# Patient Record
Sex: Female | Born: 2000 | Race: Black or African American | Hispanic: No | Marital: Single | Smoking: Never smoker
Health system: Southern US, Community
[De-identification: ages and names within clinical notes are randomized; demographics above are authoritative.]

---
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
## 2022-09-09 IMAGING — CT CT CERVICAL SPINE W/O CM
3 of 4 series · 12 of 33 positions shown, 14 images · non-contrast
Comparison: None.

CLINICAL DATA: Facial trauma, head injury, lip laceration, neck
pain

EXAM:
CT HEAD WITHOUT CONTRAST
CT MAXILLOFACIAL WITHOUT CONTRAST
CT CERVICAL SPINE WITHOUT CONTRAST
TECHNIQUE: Multidetector CT imaging of the head, cervical spine, and
maxillofacial structures were performed using the standard protocol
without intravenous contrast. Multiplanar CT image reconstructions
of the cervical spine and maxillofacial structures were also
generated.

[Series 5: sagittal bone · sagittal · 0.27mm/px · 5 of 61 slices shown, 6 images]
[im 21/61  bone]
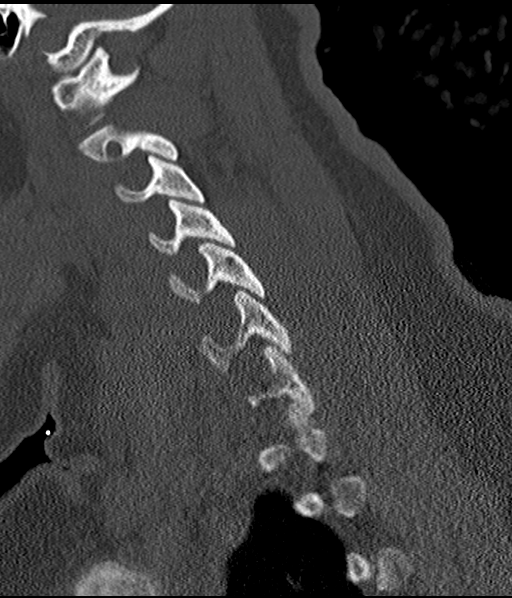
[im 26/61  bone]
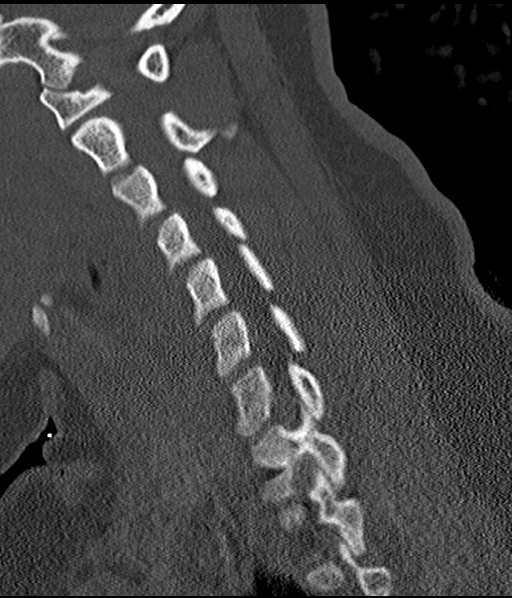
[im 31/61  soft-tissue]
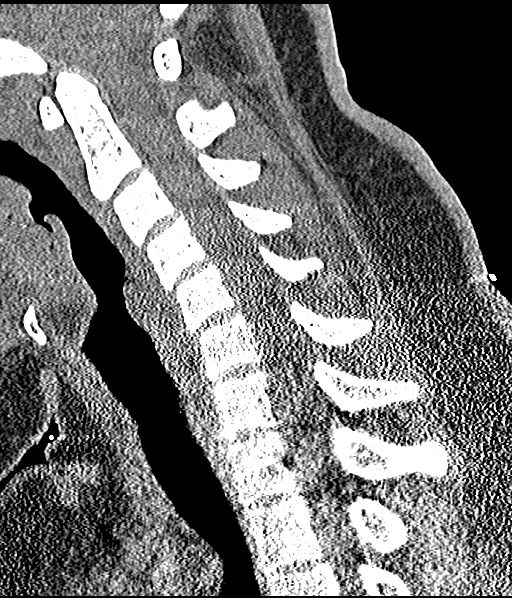
[im 31/61  bone]
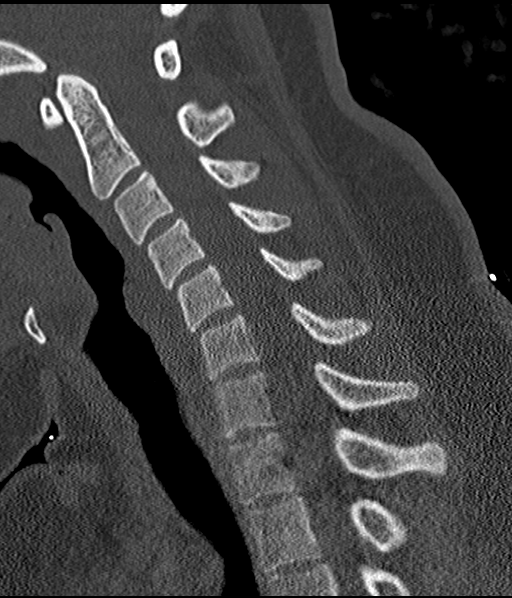
[im 36/61  bone]
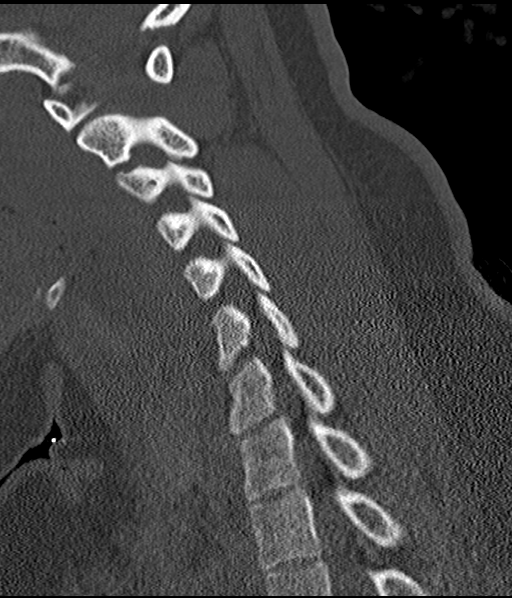
[im 41/61  bone]
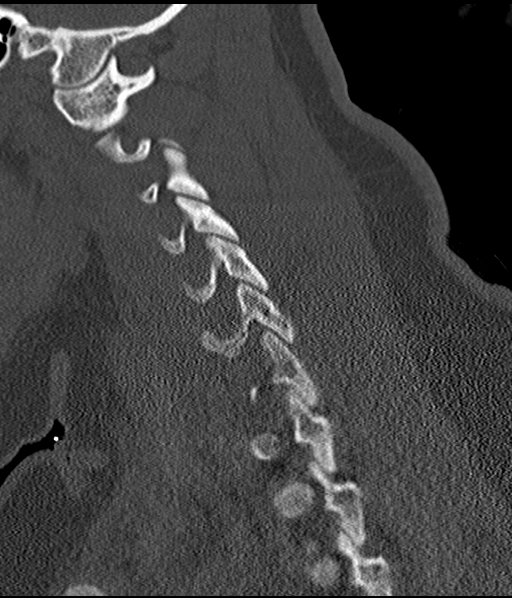

[Series 6: coronal bone · coronal · 0.23mm/px · 3 of 49 slices shown]
[im 10/49  bone]
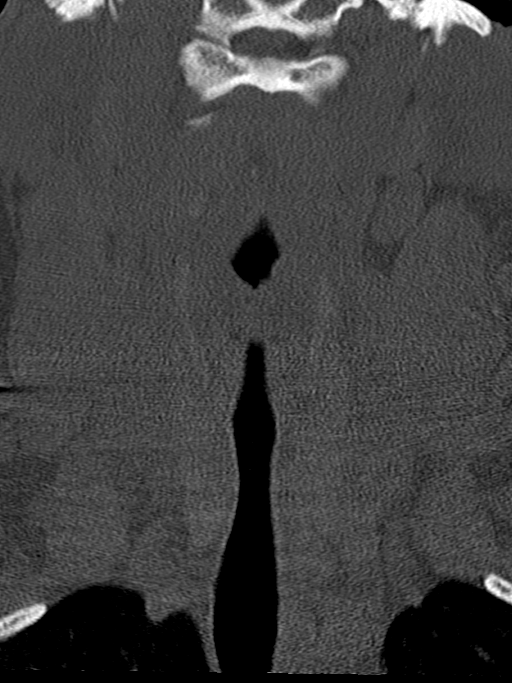
[im 20/49  bone]
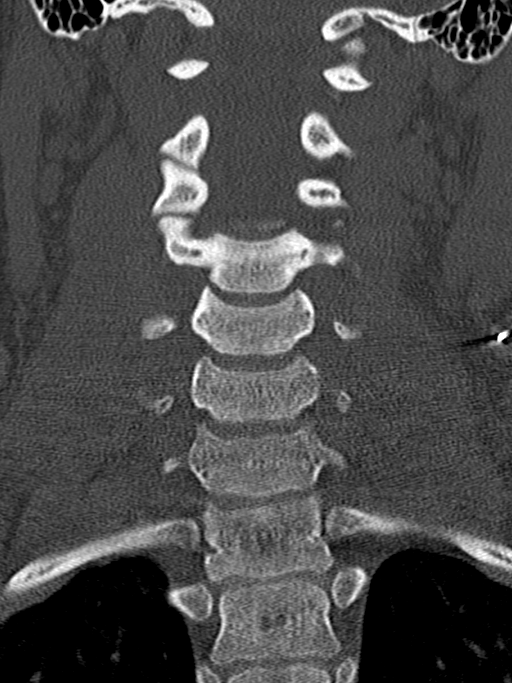
[im 29/49  bone]
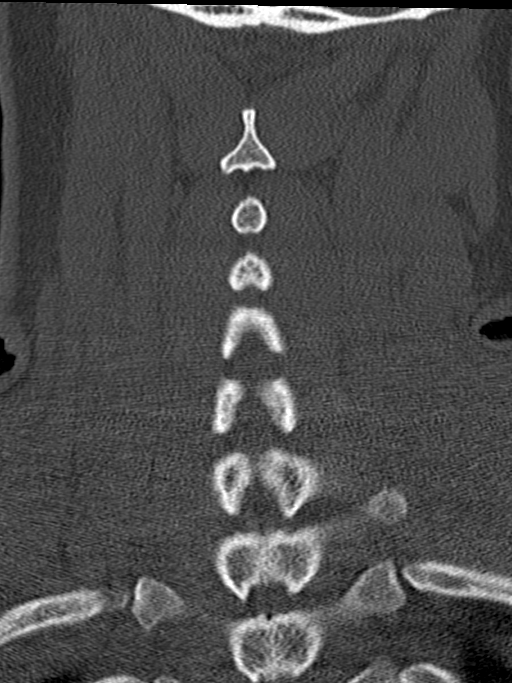

[Series 7: orthogonal axials · axial · 0.19mm/px · z∈[-362,-256]mm · 4 of 86 slices shown, 5 images]
[im 15/86  soft-tissue]
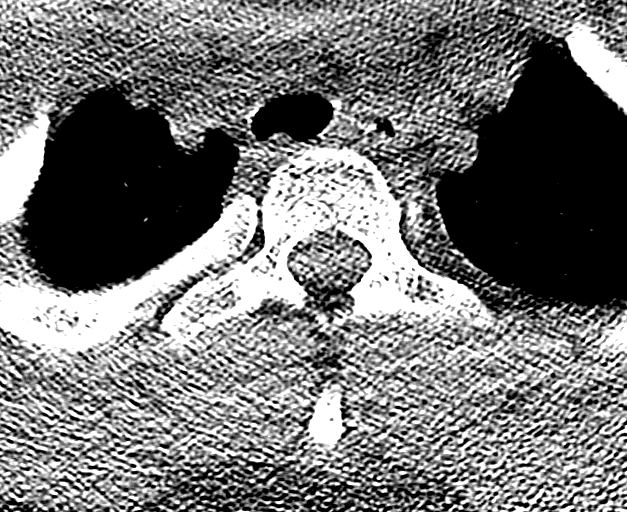
[im 15/86  bone]
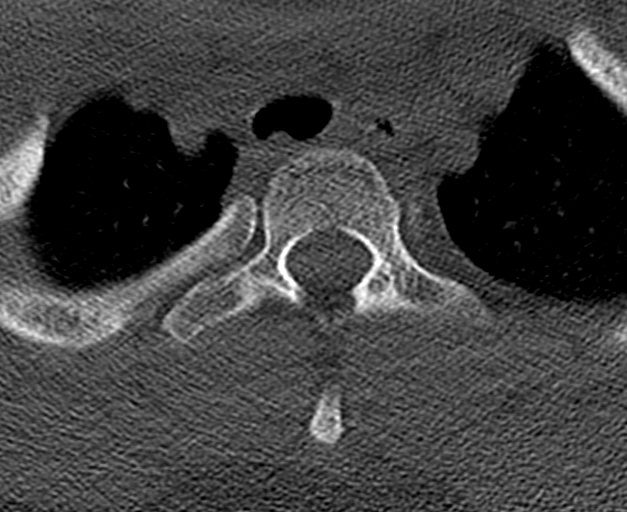
[im 29/86  bone]
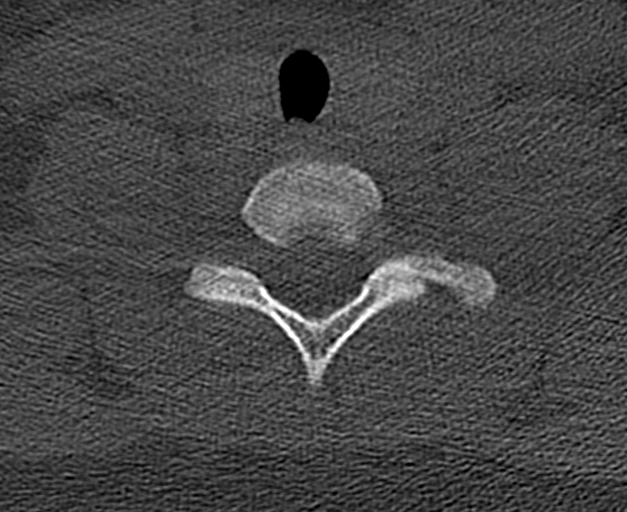
[im 57/86  bone]
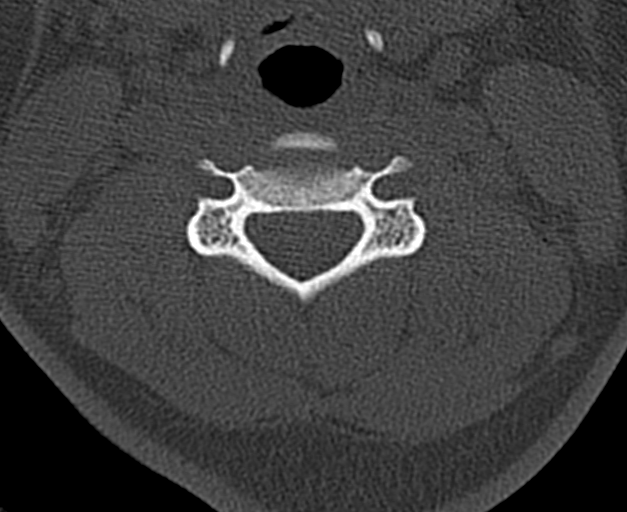
[im 71/86  bone]
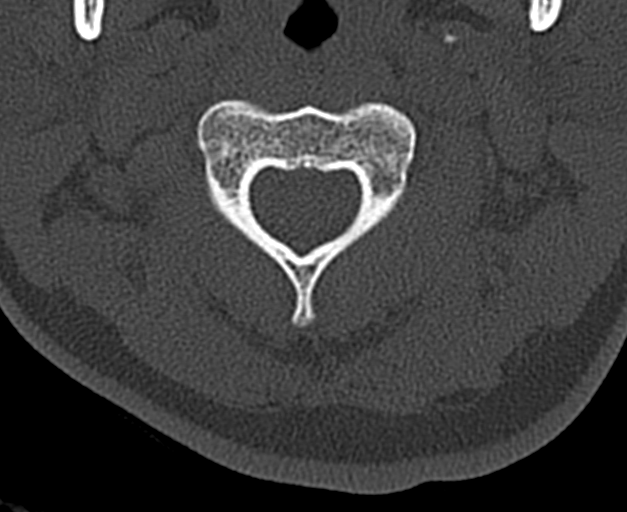

[12 of 33 positions shown; findings below may reference images not displayed]

FINDINGS: CT HEAD FINDINGS

Brain: No evidence of acute infarction, hemorrhage, hydrocephalus,
extra-axial collection or mass lesion/mass effect.

Vascular: No hyperdense vessel or unexpected calcification.

Skull: Normal. Negative for fracture or focal lesion.

Other: None.

CT MAXILLOFACIAL FINDINGS

Osseous: Left nasal bone fracture (series 3/image 51), without
overlying soft tissue swelling, favored to be chronic.

No acute maxillofacial fracture. Mandible is intact. Bilateral
mandibular condyles are well-seated in the TMJs.

Orbits: Bilateral orbits, including the globes and retroconal soft
tissues, are within normal limits.

Sinuses: The visualized paranasal sinuses are essentially clear. The
mastoid air cells are unopacified.

Soft tissues: Mild soft tissue swelling overlying the right maxilla
(series 2/image 82). Suspected mild swelling of the left upper lip
(series 2/image 30).

CT CERVICAL SPINE FINDINGS

Alignment: Reversal of the normal cervical lordosis.

Skull base and vertebrae: No acute fracture. No primary bone lesion
or focal pathologic process.

Soft tissues and spinal canal: No prevertebral fluid or swelling. No
visible canal hematoma.

Disc levels: Intervertebral disc spaces are maintained. Spinal canal
is patent.

Upper chest: Visualized lung apices are clear.

Other: Visualized thyroid is unremarkable
IMPRESSION: Normal head CT.

Mild soft tissue swelling overlying the right maxilla. No evidence
of acute maxillofacial fracture. Suspected old left nasal bone
fracture.

Normal cervical spine CT.

## 2022-09-09 IMAGING — CT CT MAXILLOFACIAL W/O CM
3 series · 15 of 47 positions shown, 18 images · non-contrast
Comparison: None.

CLINICAL DATA: Facial trauma, head injury, lip laceration, neck
pain

EXAM:
CT HEAD WITHOUT CONTRAST
CT MAXILLOFACIAL WITHOUT CONTRAST
CT CERVICAL SPINE WITHOUT CONTRAST
TECHNIQUE: Multidetector CT imaging of the head, cervical spine, and
maxillofacial structures were performed using the standard protocol
without intravenous contrast. Multiplanar CT image reconstructions
of the cervical spine and maxillofacial structures were also
generated.

[Series 2: max soft · axial · 0.36mm/px · z∈[-342,-216]mm · 9 of 75 slices shown, 12 images]
[im 6/75  brain]
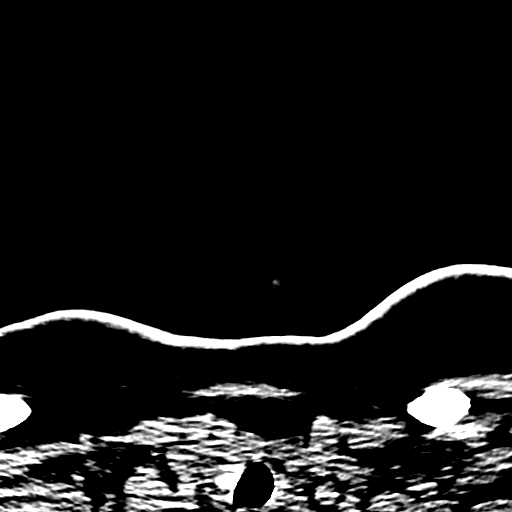
[im 6/75  bone]
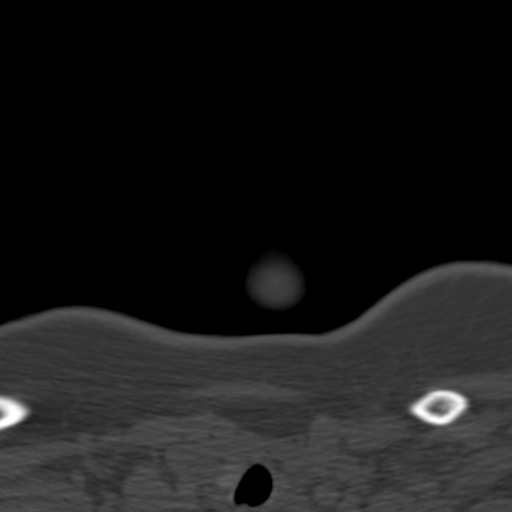
[im 13/75  bone]
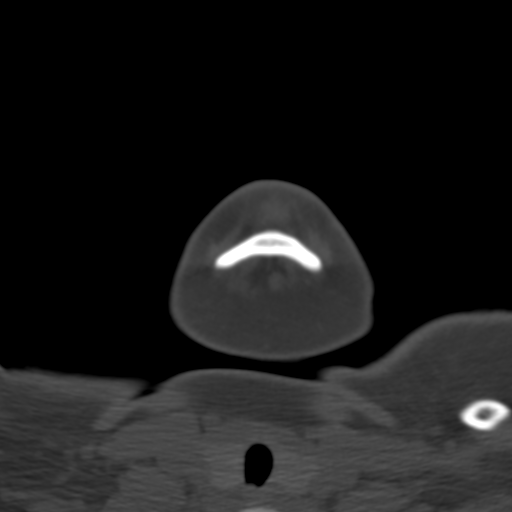
[im 21/75  bone]
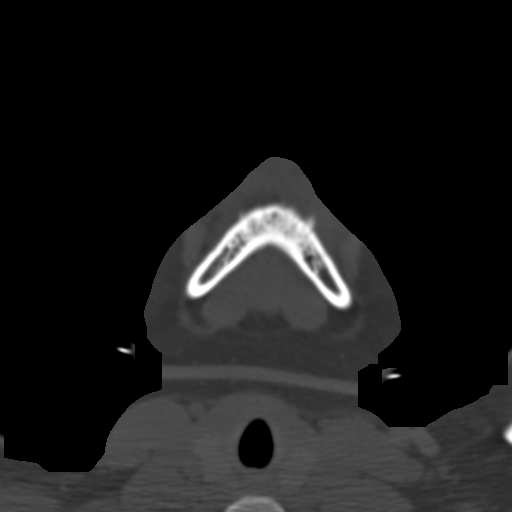
[im 29/75  bone]
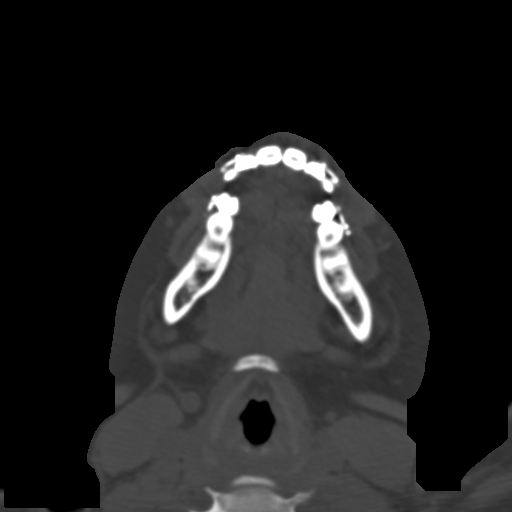
[im 39/75  brain]
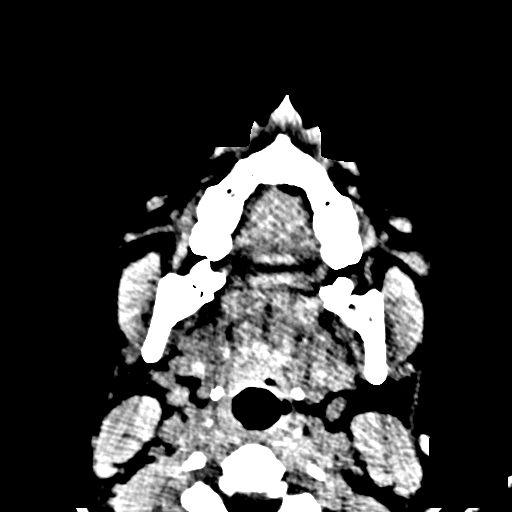
[im 39/75  bone]
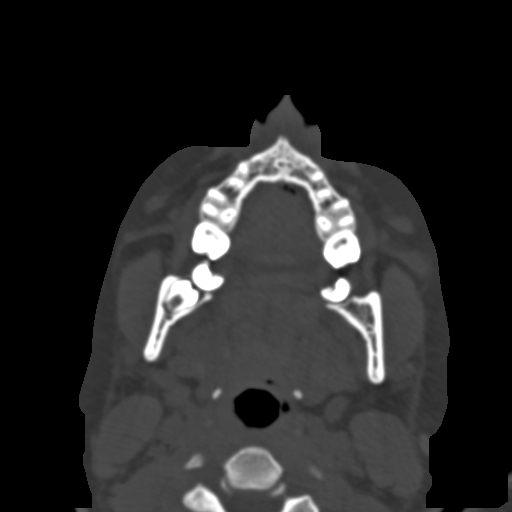
[im 46/75  bone]
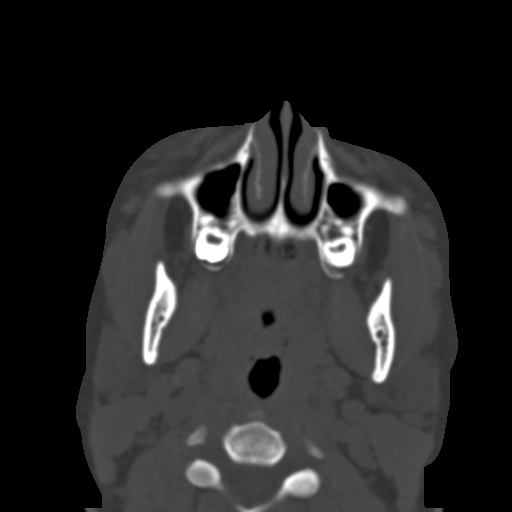
[im 54/75  bone]
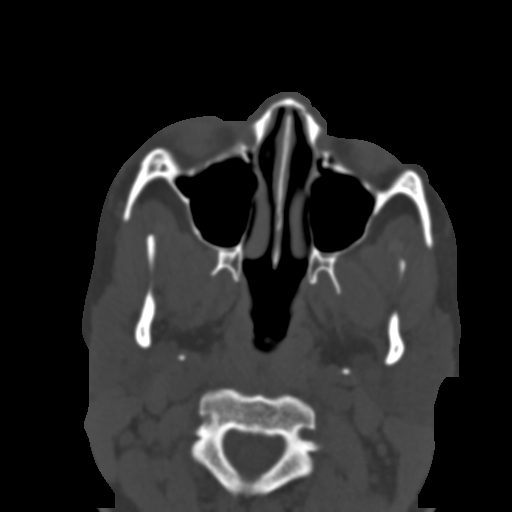
[im 62/75  bone]
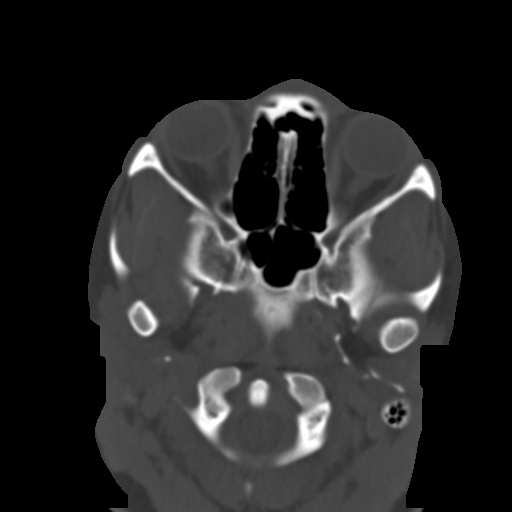
[im 69/75  brain]
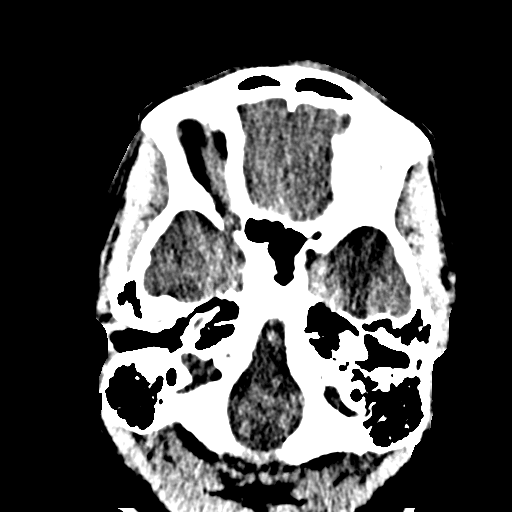
[im 69/75  bone]
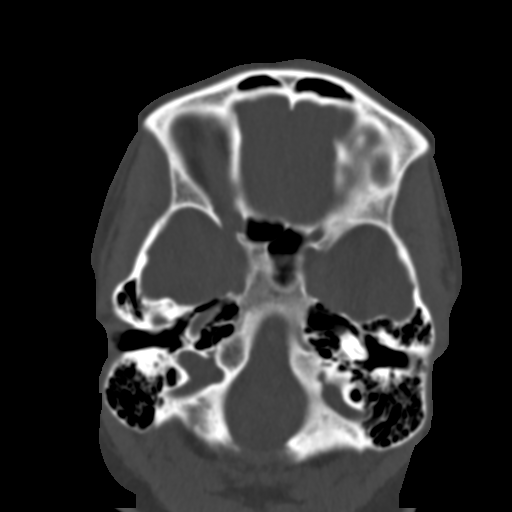

[Series 6: coronal soft · coronal · 0.30mm/px · 3 of 76 slices shown]
[im 26/76  bone]
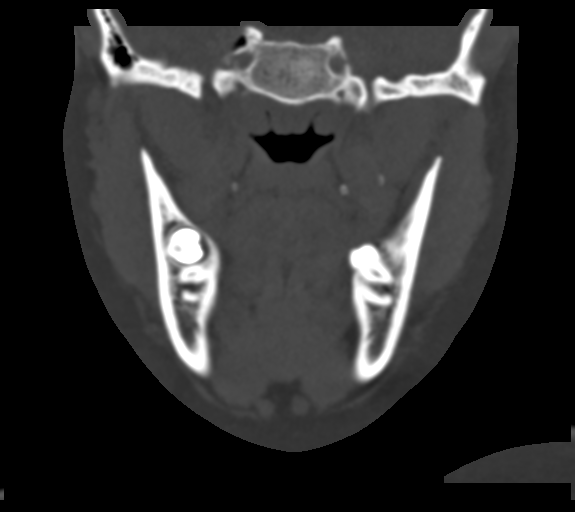
[im 34/76  bone]
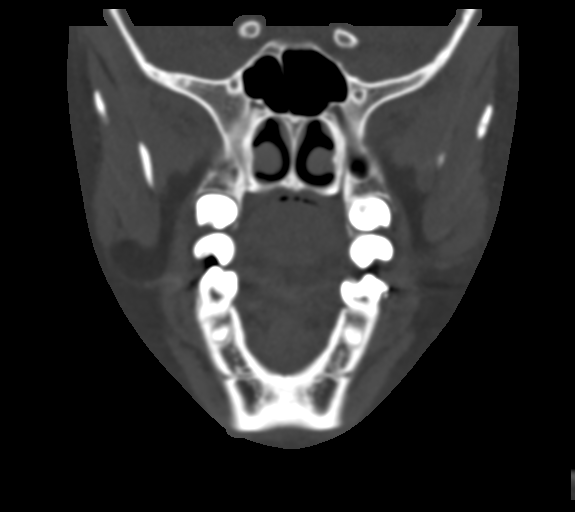
[im 42/76  bone]
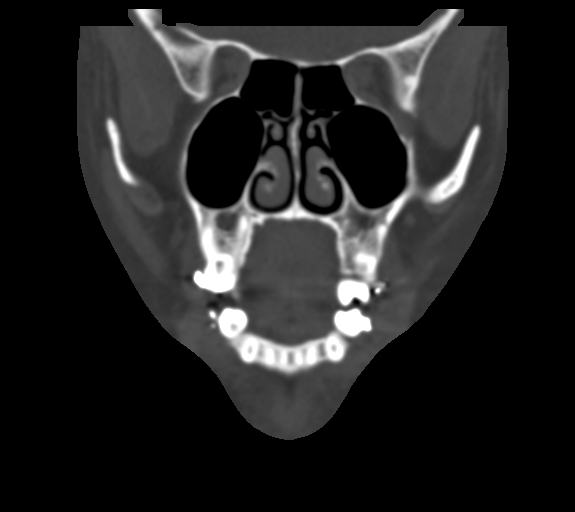

[Series 7: sagittal soft · sagittal · 0.31mm/px · 3 of 82 slices shown]
[im 28/82  bone]
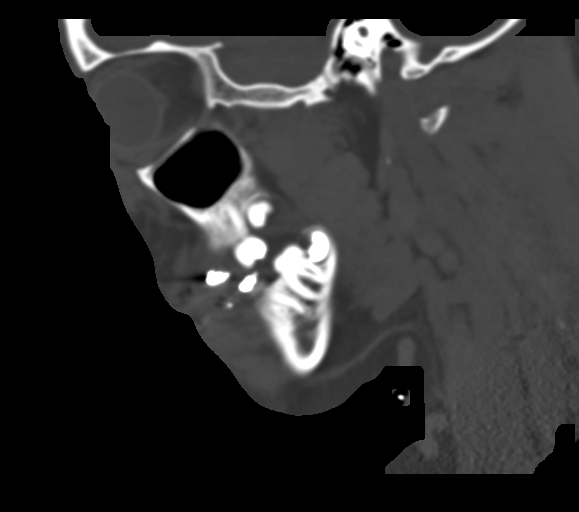
[im 41/82  bone]
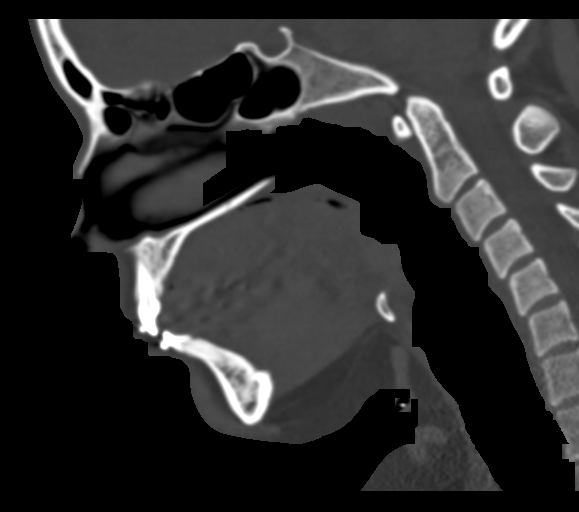
[im 55/82  bone]
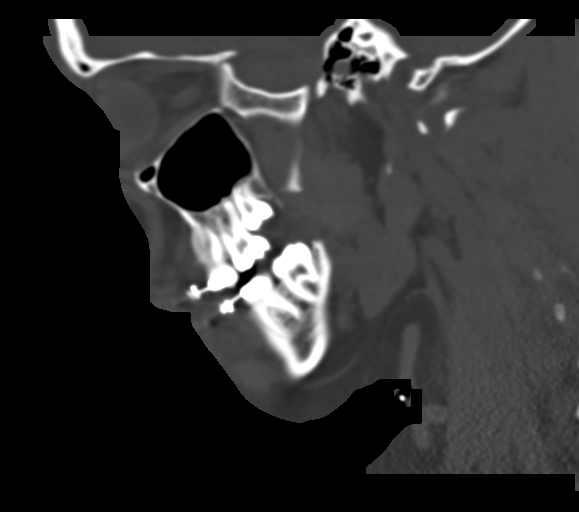

[15 of 47 positions shown; findings below may reference images not displayed]

FINDINGS: CT HEAD FINDINGS

Brain: No evidence of acute infarction, hemorrhage, hydrocephalus,
extra-axial collection or mass lesion/mass effect.

Vascular: No hyperdense vessel or unexpected calcification.

Skull: Normal. Negative for fracture or focal lesion.

Other: None.

CT MAXILLOFACIAL FINDINGS

Osseous: Left nasal bone fracture (series 3/image 51), without
overlying soft tissue swelling, favored to be chronic.

No acute maxillofacial fracture. Mandible is intact. Bilateral
mandibular condyles are well-seated in the TMJs.

Orbits: Bilateral orbits, including the globes and retroconal soft
tissues, are within normal limits.

Sinuses: The visualized paranasal sinuses are essentially clear. The
mastoid air cells are unopacified.

Soft tissues: Mild soft tissue swelling overlying the right maxilla
(series 2/image 82). Suspected mild swelling of the left upper lip
(series 2/image 30).

CT CERVICAL SPINE FINDINGS

Alignment: Reversal of the normal cervical lordosis.

Skull base and vertebrae: No acute fracture. No primary bone lesion
or focal pathologic process.

Soft tissues and spinal canal: No prevertebral fluid or swelling. No
visible canal hematoma.

Disc levels: Intervertebral disc spaces are maintained. Spinal canal
is patent.

Upper chest: Visualized lung apices are clear.

Other: Visualized thyroid is unremarkable
IMPRESSION: Normal head CT.

Mild soft tissue swelling overlying the right maxilla. No evidence
of acute maxillofacial fracture. Suspected old left nasal bone
fracture.

Normal cervical spine CT.

## 2022-09-09 IMAGING — CT CT HEAD W/O CM
3 series · 14 of 47 positions shown, 16 images · non-contrast
Comparison: None.

CLINICAL DATA: Facial trauma, head injury, lip laceration, neck
pain

EXAM:
CT HEAD WITHOUT CONTRAST
CT MAXILLOFACIAL WITHOUT CONTRAST
CT CERVICAL SPINE WITHOUT CONTRAST
TECHNIQUE: Multidetector CT imaging of the head, cervical spine, and
maxillofacial structures were performed using the standard protocol
without intravenous contrast. Multiplanar CT image reconstructions
of the cervical spine and maxillofacial structures were also
generated.

[Series 2: head wo · axial · 0.46mm/px · z∈[-235,-105]mm · 8 of 32 slices shown, 10 images]
[im 3/32  brain]
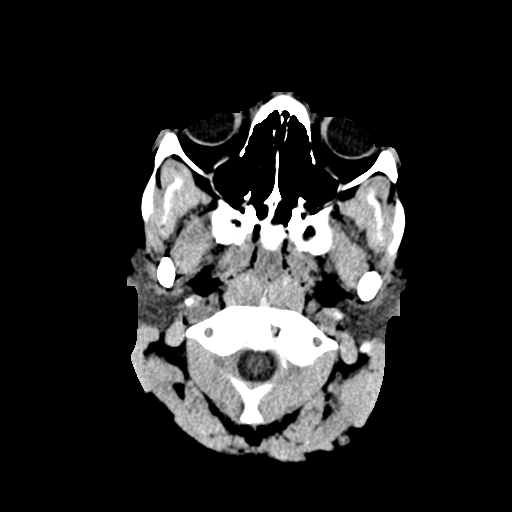
[im 3/32  bone]
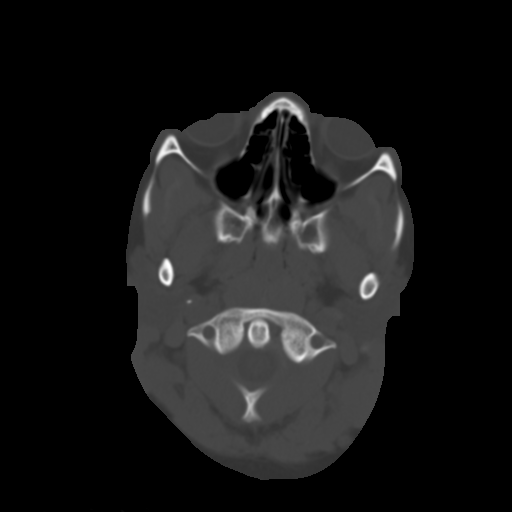
[im 7/32  brain]
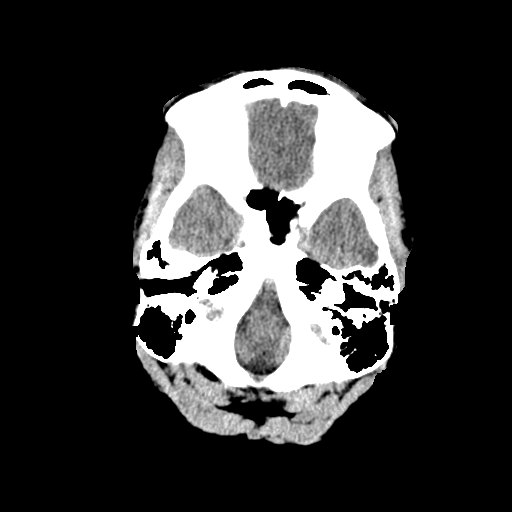
[im 10/32  brain]
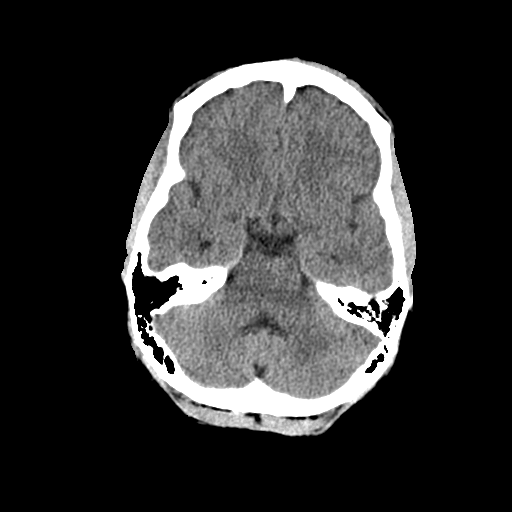
[im 14/32  brain]
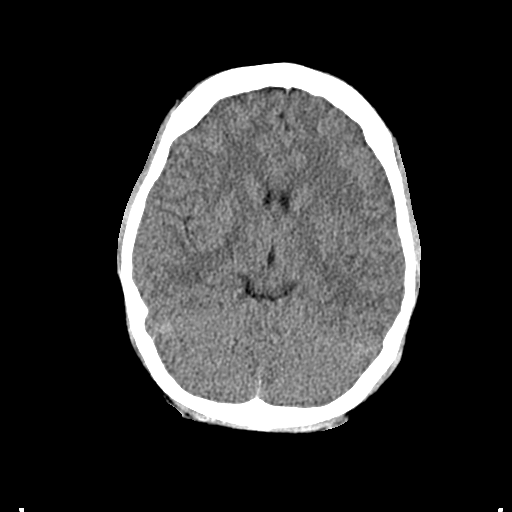
[im 18/32  brain]
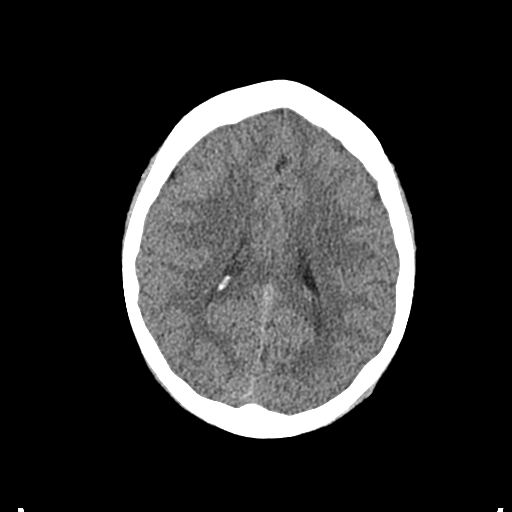
[im 18/32  bone]
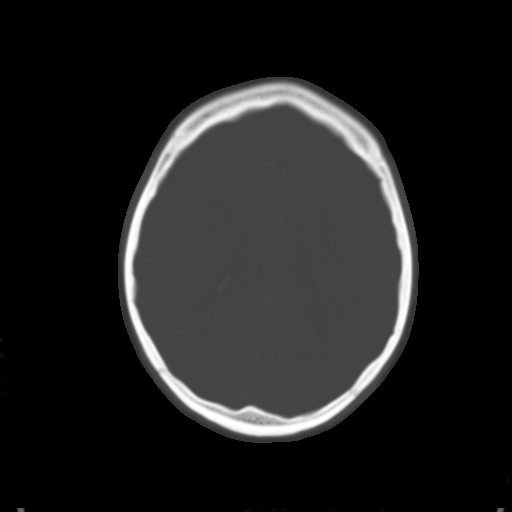
[im 22/32  brain]
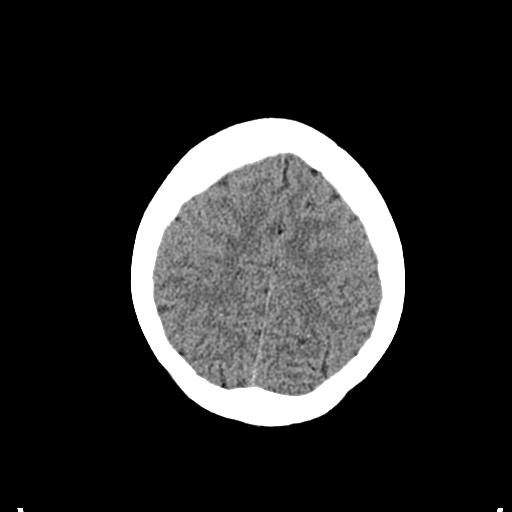
[im 25/32  brain]
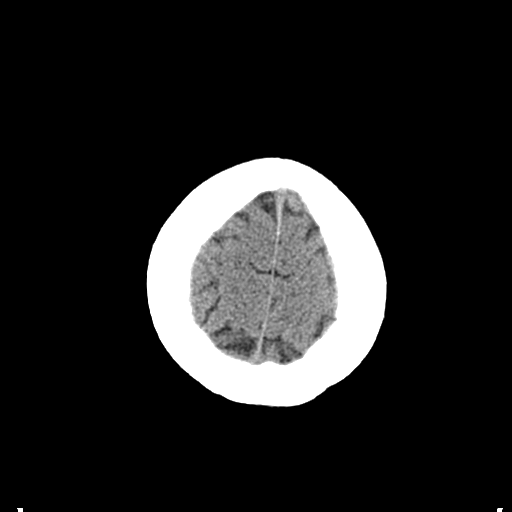
[im 29/32  brain]
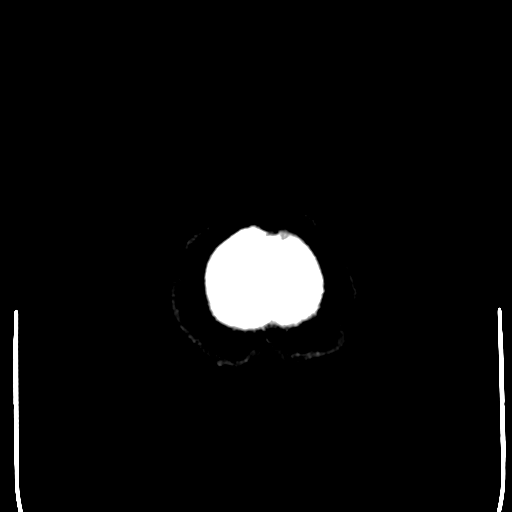

[Series 4: head coronal · coronal · 0.32mm/px · 3 of 73 slices shown]
[im 26/73  brain]
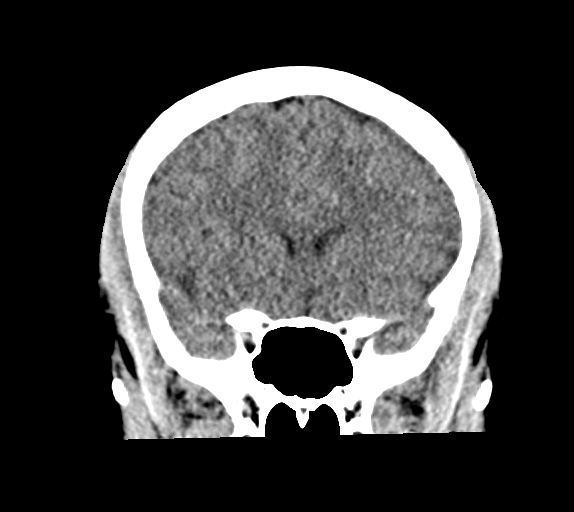
[im 33/73  brain]
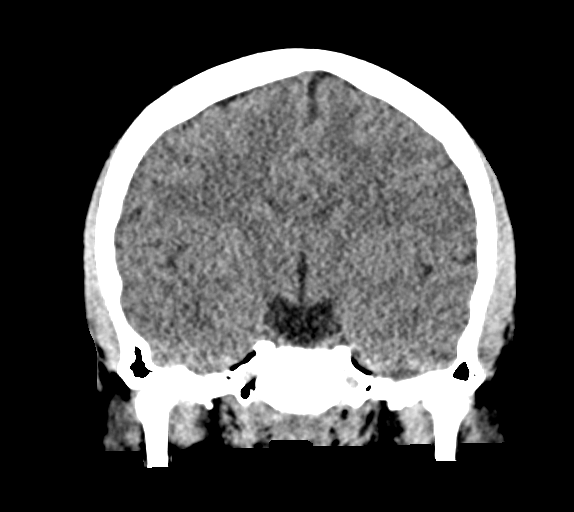
[im 40/73  brain]
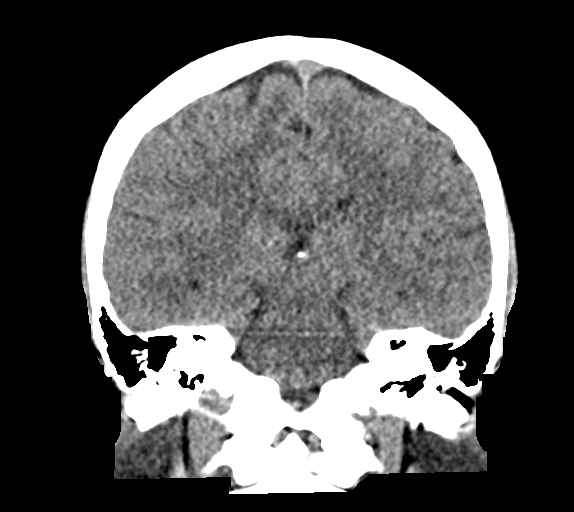

[Series 5: head sagittal · sagittal · 0.32mm/px · 3 of 59 slices shown]
[im 20/59  brain]
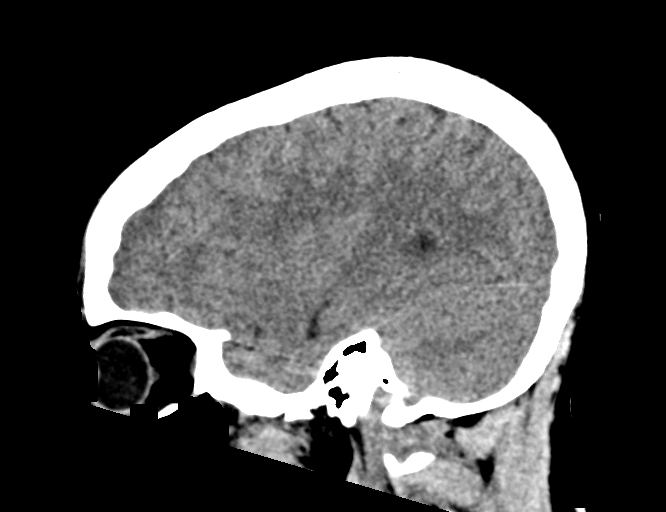
[im 30/59  brain]
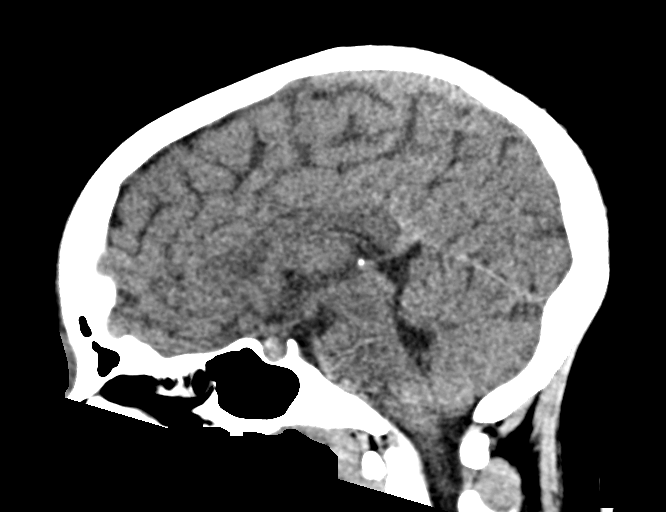
[im 39/59  brain]
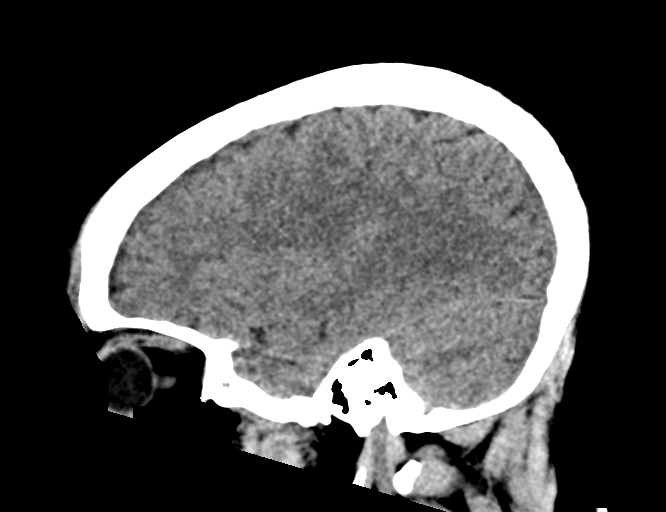

[14 of 47 positions shown; findings below may reference images not displayed]

FINDINGS: CT HEAD FINDINGS

Brain: No evidence of acute infarction, hemorrhage, hydrocephalus,
extra-axial collection or mass lesion/mass effect.

Vascular: No hyperdense vessel or unexpected calcification.

Skull: Normal. Negative for fracture or focal lesion.

Other: None.

CT MAXILLOFACIAL FINDINGS

Osseous: Left nasal bone fracture (series 3/image 51), without
overlying soft tissue swelling, favored to be chronic.

No acute maxillofacial fracture. Mandible is intact. Bilateral
mandibular condyles are well-seated in the TMJs.

Orbits: Bilateral orbits, including the globes and retroconal soft
tissues, are within normal limits.

Sinuses: The visualized paranasal sinuses are essentially clear. The
mastoid air cells are unopacified.

Soft tissues: Mild soft tissue swelling overlying the right maxilla
(series 2/image 82). Suspected mild swelling of the left upper lip
(series 2/image 30).

CT CERVICAL SPINE FINDINGS

Alignment: Reversal of the normal cervical lordosis.

Skull base and vertebrae: No acute fracture. No primary bone lesion
or focal pathologic process.

Soft tissues and spinal canal: No prevertebral fluid or swelling. No
visible canal hematoma.

Disc levels: Intervertebral disc spaces are maintained. Spinal canal
is patent.

Upper chest: Visualized lung apices are clear.

Other: Visualized thyroid is unremarkable
IMPRESSION: Normal head CT.

Mild soft tissue swelling overlying the right maxilla. No evidence
of acute maxillofacial fracture. Suspected old left nasal bone
fracture.

Normal cervical spine CT.

## 2022-11-03 ENCOUNTER — Ambulatory Visit: Payer: Medicaid Other | Admitting: Internal Medicine

## 2023-02-03 DIAGNOSIS — Z308 Encounter for other contraceptive management: Secondary | ICD-10-CM | POA: Diagnosis not present

## 2023-02-03 DIAGNOSIS — Z3202 Encounter for pregnancy test, result negative: Secondary | ICD-10-CM | POA: Diagnosis not present

## 2023-02-03 DIAGNOSIS — N760 Acute vaginitis: Secondary | ICD-10-CM | POA: Diagnosis not present

## 2023-02-03 DIAGNOSIS — Z113 Encounter for screening for infections with a predominantly sexual mode of transmission: Secondary | ICD-10-CM | POA: Diagnosis not present

## 2023-02-03 DIAGNOSIS — R300 Dysuria: Secondary | ICD-10-CM | POA: Diagnosis not present

## 2023-07-04 ENCOUNTER — Encounter (HOSPITAL_BASED_OUTPATIENT_CLINIC_OR_DEPARTMENT_OTHER): Payer: Self-pay

## 2023-07-04 ENCOUNTER — Emergency Department (HOSPITAL_BASED_OUTPATIENT_CLINIC_OR_DEPARTMENT_OTHER)

## 2023-07-04 ENCOUNTER — Emergency Department (HOSPITAL_BASED_OUTPATIENT_CLINIC_OR_DEPARTMENT_OTHER)
Admission: EM | Admit: 2023-07-04 | Discharge: 2023-07-04 | Disposition: A | Discharge status: Home / Self Care | Attending: Emergency Medicine | Admitting: Emergency Medicine

## 2023-07-04 DIAGNOSIS — Z043 Encounter for examination and observation following other accident: Secondary | ICD-10-CM | POA: Diagnosis not present

## 2023-07-04 DIAGNOSIS — S93492A Sprain of other ligament of left ankle, initial encounter: Secondary | ICD-10-CM | POA: Diagnosis not present

## 2023-07-04 DIAGNOSIS — M25572 Pain in left ankle and joints of left foot: Secondary | ICD-10-CM | POA: Diagnosis not present

## 2023-07-04 DIAGNOSIS — Y990 Civilian activity done for income or pay: Secondary | ICD-10-CM | POA: Insufficient documentation

## 2023-07-04 DIAGNOSIS — X501XXA Overexertion from prolonged static or awkward postures, initial encounter: Secondary | ICD-10-CM | POA: Insufficient documentation

## 2023-07-04 DIAGNOSIS — S93402A Sprain of unspecified ligament of left ankle, initial encounter: Secondary | ICD-10-CM | POA: Diagnosis not present

## 2023-07-04 NOTE — Discharge Instructions (Addendum)
 You are given a walking boot, and crutches.  X-rays did not show any fracture.  You likely have an ankle sprain.  Return for any concerning symptoms.  Follow-up with the sports medicine doctor.  Take ibuprofen  600 mg 3 times a day for the next 5 to 7 days.  Ice the area for 10 to 15 minutes every 4-5 hours while you are awake.  You can bear weight as tolerated.

## 2023-07-04 NOTE — ED Triage Notes (Signed)
 Pt reports that she fell on Friday and is unable to bare weight on left ankle.

## 2023-07-04 NOTE — ED Provider Notes (Signed)
 [place] EMERGENCY DEPARTMENT AT [facility] Provider Note   CSN: [id number] Arrival date & time: 07/04/23  1325     Patient presents with: Ankle Pain   Chelsea Brown is a 23 y.o. female.   23 year old female presents today for concern of left ankle pain.  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  Has had some pain since then however pain worsened today and has not been able to bear weight.  Denies any other injury.  The history is provided by the patient. No language interpreter was used.       Prior to Admission medications   Medication Sig Start Date End Date Taking? Authorizing Provider  cetirizine  (ZYRTEC  ALLERGY) 10 MG tablet Take 1 tablet (10 mg total) by mouth daily. 03/22/20   Tonette Lauraine HERO, PA-C  fluticasone  (FLONASE ) 50 MCG/ACT nasal spray Place 2 sprays into both nostrils daily. 03/22/20   Tonette Lauraine HERO, PA-C    Allergies: Patient has no known allergies.    Review of Systems  Musculoskeletal:  Positive for arthralgias. Negative for joint swelling.  All other systems reviewed and are negative.   Updated Vital Signs BP 128/84 (BP Location: Right Arm)   Pulse 82   Temp 98.2 F (36.8 C)   Resp 18   Ht 5' 3 (1.6 m)   Wt 104.3 kg   LMP 07/01/2023 (Exact Date)   SpO2 99%   BMI 40.74 kg/m   Physical Exam Vitals and nursing note reviewed.  Constitutional:      General: She is not in acute distress.    Appearance: Normal appearance. She is not ill-appearing.  HENT:     Head: Normocephalic and atraumatic.     Nose: Nose normal.   Eyes:     Conjunctiva/sclera: Conjunctivae normal.   Pulmonary:     Effort: Pulmonary effort is normal. No respiratory distress.   Musculoskeletal:        General: No deformity.     Comments: Left ankle with mild tenderness to palpation.  Full range of motion with plantarflexion and dorsi flexion.  Neurovascularly  intact.  No visible joint swelling, bruising.   Skin:    Findings: No rash.   Neurological:     Mental Status: She is alert.     (all labs ordered are listed, but only abnormal results are displayed) Labs Reviewed - No data to display  EKG: None  Radiology: DG Foot Complete Left Result Date: 07/04/2023 CLINICAL DATA:  Status post fall. EXAM: LEFT FOOT - COMPLETE 3+ VIEW COMPARISON:  None Available. FINDINGS: There is no evidence of fracture or dislocation. There is no evidence of arthropathy or other focal bone abnormality. Soft tissues are unremarkable. IMPRESSION: Negative. Electronically Signed   By: Waddell Calk M.D.   On: 07/04/2023 14:07   DG Ankle Complete Left Result Date: 07/04/2023 CLINICAL DATA:  Ankle pain following fall. EXAM: LEFT ANKLE COMPLETE - 3+ VIEW COMPARISON:  07/04/2023. FINDINGS: There is no evidence of acute fracture or dislocation. There is no evidence of arthropathy or other focal bone abnormality. Soft tissue swelling is noted about the ankle and foot. IMPRESSION: No acute fracture or dislocation. Electronically Signed   By: Leita Waddell M.D.   On: 07/04/2023 14:05     Procedures   Medications Ordered in the ED - No data to display  Medical Decision Making Amount and/or Complexity of Data Reviewed Radiology: ordered.   23 year old female presents with left ankle injury.  No fractures on x-rays.  Independently reviewed and agree with radiology interpretation.  Likely ankle sprain.  Reassuring exam.  Crutches, walking boot provided.  Ortho referral given.  Discharged in stable condition.  Return precaution discussed.  Patient voices understanding and is in agreement with plan.  Final diagnoses:  Sprain of left ankle, unspecified ligament, initial encounter    ED Discharge Orders     None          Hildegard Loge, PA-C 07/04/23 1528    Elnor Savant A, DO 07/07/23 0740

## 2023-07-07 ENCOUNTER — Encounter: Payer: Self-pay | Admitting: Sports Medicine

## 2023-07-07 ENCOUNTER — Ambulatory Visit (INDEPENDENT_AMBULATORY_CARE_PROVIDER_SITE_OTHER): Admitting: Sports Medicine

## 2023-07-07 VITALS — BP 112/74 | Ht 63.0 in | Wt 230.0 lb

## 2023-07-07 DIAGNOSIS — S93492A Sprain of other ligament of left ankle, initial encounter: Secondary | ICD-10-CM

## 2023-07-07 DIAGNOSIS — S93402A Sprain of unspecified ligament of left ankle, initial encounter: Secondary | ICD-10-CM | POA: Diagnosis not present

## 2023-07-07 NOTE — Progress Notes (Signed)
   Subjective:    Patient ID: Chelsea Brown, female    DOB: 2000/02/16, 22 y.o.   MRN: [id number]  HPI chief complaint: Left ankle pain  Patient is a very pleasant 23 year old female that presents today with left ankle pain.  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  She had immediate ankle pain but when she developed foot pain later on she became concerned and was seen in the emergency room.  X-rays of both the left foot and ankle were negative for fracture.  She was placed into a cam walker and given crutches and comes in today for follow-up.  She did injure this same ankle several weeks prior but did not seek medical attention for that injury.  She localizes most of her pain to the anterior and lateral ankle where she has also developed some swelling.  Past medical history reviewed Medications reviewed Allergies reviewed  Review of Systems As above    Objective:   Physical Exam  Well-developed, well-nourished.  No acute distress  Left ankle: Good range of motion.  No effusion.  Mild soft tissue swelling along the lateral ankle.  No ecchymosis.  She is tender to palpation at both the ATF and calcaneofibular ligament.  Positive anterior drawer, 2+ talar tilt.  No tenderness over the medial ankle.  No tenderness at the base of the fifth metatarsal.  Good pulses.  X-rays of the left ankle and foot are as above      Assessment & Plan:   Left ankle sprain  Patient will discontinue her cam walker in favor of a med spec brace.  She may wean from her crutches as her pain allows.  She will start physical therapy at [facility] and will wean to a home exercise program per the therapist discretion.  I did recommend that she wear her med spec brace for an additional 3 months with any activity that may place her at risk of repeat inversion injury.  She may use over-the-counter pain medications or ice as needed.  She will follow-up for  ongoing or recalcitrant issues.  This note was dictated using Dragon naturally speaking software and may contain errors in syntax, spelling, or content which have not been identified prior to signing this note.
